# Patient Record
Sex: Female | Born: 1965 | Smoking: Never smoker
Health system: Southern US, Community
[De-identification: ages and names within clinical notes are randomized; demographics above are authoritative.]

## PROBLEM LIST (undated history)

## (undated) DIAGNOSIS — E669 Obesity, unspecified: Secondary | ICD-10-CM

## (undated) DIAGNOSIS — I10 Essential (primary) hypertension: Secondary | ICD-10-CM

## (undated) DIAGNOSIS — N809 Endometriosis, unspecified: Secondary | ICD-10-CM

## (undated) DIAGNOSIS — M722 Plantar fascial fibromatosis: Secondary | ICD-10-CM

## (undated) DIAGNOSIS — D649 Anemia, unspecified: Secondary | ICD-10-CM

## (undated) HISTORY — PX: ABDOMINAL HYSTERECTOMY: SHX81

---

## 2007-06-29 ENCOUNTER — Emergency Department: Payer: Self-pay | Admitting: Internal Medicine

## 2007-07-01 ENCOUNTER — Ambulatory Visit: Payer: Self-pay | Admitting: Rheumatology

## 2007-07-01 ENCOUNTER — Ambulatory Visit: Payer: Self-pay | Admitting: Unknown Physician Specialty

## 2008-04-28 ENCOUNTER — Ambulatory Visit: Payer: Self-pay

## 2008-04-29 ENCOUNTER — Ambulatory Visit: Payer: Self-pay

## 2009-02-01 ENCOUNTER — Ambulatory Visit: Payer: Self-pay | Admitting: Internal Medicine

## 2012-08-22 ENCOUNTER — Ambulatory Visit: Payer: Self-pay | Admitting: Family Medicine

## 2013-07-16 ENCOUNTER — Ambulatory Visit: Payer: Self-pay

## 2013-10-21 ENCOUNTER — Ambulatory Visit: Payer: Self-pay | Admitting: Gynecologic Oncology

## 2013-10-21 ENCOUNTER — Ambulatory Visit: Payer: Self-pay | Admitting: Cardiothoracic Surgery

## 2013-11-18 ENCOUNTER — Ambulatory Visit: Payer: Self-pay | Admitting: Gynecologic Oncology

## 2013-11-18 LAB — URINALYSIS, COMPLETE
Bacteria: NONE SEEN
Blood: NEGATIVE
Glucose,UR: NEGATIVE mg/dL (ref 0–75)
Ketone: NEGATIVE
Leukocyte Esterase: NEGATIVE
Nitrite: NEGATIVE
Protein: NEGATIVE
Specific Gravity: 1.013 (ref 1.003–1.030)
Squamous Epithelial: 1

## 2013-11-18 LAB — HEMOGLOBIN: HGB: 14.4 g/dL (ref 12.0–16.0)

## 2013-11-18 LAB — PREGNANCY, URINE: Pregnancy Test, Urine: NEGATIVE m[IU]/mL

## 2013-11-18 LAB — POTASSIUM: Potassium: 3.6 mmol/L (ref 3.5–5.1)

## 2013-11-20 ENCOUNTER — Ambulatory Visit: Payer: Self-pay | Admitting: Cardiothoracic Surgery

## 2013-11-20 ENCOUNTER — Ambulatory Visit: Payer: Self-pay | Admitting: Gynecologic Oncology

## 2013-11-25 ENCOUNTER — Inpatient Hospital Stay: Payer: Self-pay | Admitting: Obstetrics and Gynecology

## 2013-11-26 LAB — HEMATOCRIT: HCT: 33.1 % — ABNORMAL LOW (ref 35.0–47.0)

## 2013-11-27 LAB — PATHOLOGY REPORT

## 2014-04-07 ENCOUNTER — Ambulatory Visit (INDEPENDENT_AMBULATORY_CARE_PROVIDER_SITE_OTHER): Payer: BC Managed Care – PPO

## 2014-04-07 ENCOUNTER — Encounter: Payer: Self-pay | Admitting: Podiatry

## 2014-04-07 ENCOUNTER — Ambulatory Visit (INDEPENDENT_AMBULATORY_CARE_PROVIDER_SITE_OTHER): Payer: BC Managed Care – PPO | Admitting: Podiatry

## 2014-04-07 ENCOUNTER — Ambulatory Visit: Payer: Self-pay | Admitting: Podiatry

## 2014-04-07 VITALS — BP 125/80 | HR 89 | Resp 16 | Ht 60.0 in | Wt 190.0 lb

## 2014-04-07 DIAGNOSIS — M79673 Pain in unspecified foot: Secondary | ICD-10-CM

## 2014-04-07 DIAGNOSIS — M79609 Pain in unspecified limb: Secondary | ICD-10-CM

## 2014-04-07 DIAGNOSIS — M2010 Hallux valgus (acquired), unspecified foot: Secondary | ICD-10-CM

## 2014-04-07 DIAGNOSIS — M722 Plantar fascial fibromatosis: Secondary | ICD-10-CM

## 2014-04-07 MED ORDER — TRIAMCINOLONE ACETONIDE 10 MG/ML IJ SUSP
10.0000 mg | Freq: Once | INTRAMUSCULAR | Status: AC
  Administered 2014-04-07: 10 mg

## 2014-04-07 MED ORDER — DICLOFENAC SODIUM 75 MG PO TBEC: 75.0000 mg | DELAYED_RELEASE_TABLET | Freq: Two times a day (BID) | ORAL | Status: DC

## 2014-04-07 NOTE — Progress Notes (Signed)
Subjective:     Patient ID: Lindsay Dunn, female   DOB: [birth year], 48 y.o.   MRN: [id number]  Foot Pain   patient states that her right heels been hurting her for around 48 months when she returns from work after hysterectomy. Patient also states she has a bunion left digits occasionally sore and makes it hard wear certain shoes   Review of Systems  All other systems reviewed and are negative.      Objective:   Physical Exam  Nursing note and vitals reviewed. Constitutional: She is oriented to person, place, and time.  Cardiovascular: Intact distal pulses.   Musculoskeletal: Normal range of motion.  Neurological: She is oriented to person, place, and time.  Skin: Skin is warm.   neurovascular status intact with muscle strength adequate and range of motion of the subtalar and midtarsal joint within normal limits. 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 Her focusing on the right foot today I injected with 3 mg Kenalog 5 mg Xylocaine Marcaine mixture into the right plantar heel and I applied fascially brace. Gave instructions on long-term orthotics which we will make him next visit and discuss surgery for the left bunion which we will hold off on as the pain is not to the degree I would expect for her.

## 2014-04-07 NOTE — Progress Notes (Signed)
   Subjective:    Patient ID: Lindsay Dunn, female    DOB: 04-Jun-1966, 48 y.o.   MRN: [id number]  HPI Comments: i have right heel pain and both of my feet swell. Its been going on for a couple of weeks. The pain has gotten worse. It hurts to stand and walk. i bought some straps for my feet.  Foot Pain      Review of Systems  Gastrointestinal: Positive for blood in stool.  All other systems reviewed and are negative.      Objective:   Physical Exam        Assessment & Plan:

## 2014-04-07 NOTE — Patient Instructions (Signed)

## 2014-04-14 ENCOUNTER — Ambulatory Visit: Payer: BC Managed Care – PPO | Admitting: Podiatry

## 2014-04-21 ENCOUNTER — Ambulatory Visit (INDEPENDENT_AMBULATORY_CARE_PROVIDER_SITE_OTHER): Payer: BC Managed Care – PPO | Admitting: Podiatry

## 2014-04-21 DIAGNOSIS — M722 Plantar fascial fibromatosis: Secondary | ICD-10-CM

## 2014-04-22 NOTE — Progress Notes (Signed)
Subjective:     Patient ID: Lindsay Dunn, female   DOB: [birth year], 48 y.o.   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

## 2014-04-29 ENCOUNTER — Encounter: Payer: Self-pay | Admitting: *Deleted

## 2014-04-29 NOTE — Progress Notes (Signed)
Sent pt post card letting her know orthotics are here. 

## 2014-05-05 ENCOUNTER — Ambulatory Visit (INDEPENDENT_AMBULATORY_CARE_PROVIDER_SITE_OTHER): Payer: BC Managed Care – PPO | Admitting: *Deleted

## 2014-05-05 DIAGNOSIS — M722 Plantar fascial fibromatosis: Secondary | ICD-10-CM

## 2014-05-05 NOTE — Patient Instructions (Signed)

## 2014-06-02 ENCOUNTER — Other Ambulatory Visit: Payer: BC Managed Care – PPO

## 2014-10-14 ENCOUNTER — Other Ambulatory Visit: Payer: Self-pay | Admitting: *Deleted

## 2014-10-14 MED ORDER — DICLOFENAC SODIUM 75 MG PO TBEC: 75.0000 mg | DELAYED_RELEASE_TABLET | Freq: Two times a day (BID) | ORAL | Status: AC

## 2014-10-14 NOTE — Telephone Encounter (Signed)
Pharmacy fax refill diclofenac . Ok

## 2014-12-08 ENCOUNTER — Ambulatory Visit: Payer: Self-pay | Admitting: Podiatry

## 2015-03-13 NOTE — Op Note (Signed)
PATIENT NAME:  Lindsay Dunn, Lindsay Dunn MR#:  [id number] DATE OF BIRTH:  Nov 18, 1966  DATE OF PROCEDURE:  11/25/2013  PREOPERATIVE DIAGNOSES: Bilateral adnexal masses, dysmenorrhea.  POSTOPERATIVE DIAGNOSES: Bilateral adnexal masses, dysmenorrhea, severe adhesive disease and endometriosis.   PROCEDURES PERFORMED: Total abdominal hysterectomy, bilateral salpingo-oophorectomy, adhesiolysis, cystoscopy.  SURGEON: Lindsay L. Logan BoresEvans, Dunn  ASSISTANT: Scrub.   ANESTHESIA: General endotracheal.   FINDINGS: Fibroid uterus densely adherent to bilateral adnexa containing what was found to be endometriomas. Large posterior fibroid closely adherent to the rectum.   ANTIBIOTICS: Two grams Ancef IV.   DRAINS: Foley.   COMPLICATIONS: None.   PROCEDURE IN DETAIL: Dr. Wendy PoetBrigitte Miller had evaluated the patient and recommended TAH/BSO. She was on standby throughout the case. With the aid of scrub tech, after prepping and draping, making sure she received antibiotics and placing a Foley catheter a #15 blade was used to make a Pfannenstiel incision and peritoneal entry was made, bowel was packed, and self-retaining retractor was placed. I immediately noted adhesions posteriorly in bilateral adnexa. We began to bluntly and sharply dissect these free. We were able to mobilize and identify the round ligaments which were grasped, ligated and divided bilaterally. We did alternating clamp, cut and tie using 0 Vicryl and Heaney-Ballantine clamps, taking care to develop the bladder flap and avoid the bladder, until we felt like we were past the uterine artery. At this point, we decapitated the uterus and Kocher was placed on the anterior lip of the cervix. There was a  large posterior fibroid which previously was felt to be an adhesion was identified. This was bluntly and sharply dissected free and delivered. We were then able to clamp, cut and tie using straights then curved Heaney-Ballantines delivering the cervix. The cuff was then closed  with mattress sutures of 0 Vicryl.   The left ovary was grasped with Babcock elevator bluntly and dissected so we could get around the round ligament starting around the infundibulopelvic ligament which was clamped with Heaney-Ballantine, divided and suture ligated. We proceeded in a similar fashion on the right. Again, both cysts were ruptured at the beginning of the case with spillage of copious amounts of chocolate fluid.   The pelvis was copiously irrigated. The area seemed to be hemostatic.   The pelvis was flooded again with sterile water and empty [place] was placed through the anus and approximately 100 mL of air was infused with no bubbling seen in the area of the dissection of the posterior fibroid. At this point, we turned our attention to cystoscope.   I asked for methylene blue to be given IV and under cystoscopic evaluation spillage was seen from bilateral internal ureteral orifices. Bladder mucosa appeared to be intact. Foley catheter was replaced. Rectus muscle was inspected for hemostasis and made hemostatic with cautery. The fascia was closed right to left with 0 Vicryl. Subcu was made hemostatic with cautery, and the skin was closed with surgical clips. Pressure dressing was applied.   The patient tolerated the procedure well. I anticipate a routine postoperative course.   Estimated blood loss was estimated to be approximately 700 mL.   ____________________________ Clide Clifficky L. Logan BoresEvans, Dunn rle:sb D: 11/25/2013 15:37:20 ET T: 11/25/2013 15:57:29 ET JOB#: [id number]  cc: Lindsay L. Logan BoresEvans, Dunn, <Dictator> Lindsay Dunn ELECTRONICALLY SIGNED 11/26/2013 8:24

## 2015-03-13 NOTE — Discharge Summary (Signed)
Dates of Admission and Diagnosis:  Date of Admission 25-Nov-2013   Date of Discharge 01-Jan-0001   Admitting Diagnosis adnexal masses   Final Diagnosis same + endometriosis; s/p TAH, LOA, BSO    Chief Complaint/History of Present Illness ibid   Hospital Course:  Hospital Course did well afeb thruout  passed flatus this a.m.   Condition on Discharge Good   DISCHARGE INSTRUCTIONS HOME MEDS:  Medication Reconciliation: Patient's Home Medications at Discharge:     Medication Instructions  hydrochlorothiazide-lisinopril 12.5 mg-10 mg oral tablet  1 tab(s) orally once a day   norco 325 mg-5 mg oral tablet  1/2 - 2 tab(s) orally every 6 hours   acetaminophen-hydrocodone 325 mg-5 mg oral tablet  1 tab(s) orally every 4 to 6 hours, As needed, pain     Physician's Instructions:  Diet Regular   Activity Limitations No exertional activity  No heavy lifting   Return to Work after follow up visit with MD   Time frame for Follow Up Appointment 2-4 weeks   Electronic Signatures: Margaretha GlassingEvans, Ricky L (MD)  (Signed 09-Jan-15 07:35)  Authored: ADMISSION DATE AND DIAGNOSIS, CHIEF COMPLAINT/HPI, HOSPITAL COURSE, DISCHARGE INSTRUCTIONS HOME MEDS, PATIENT INSTRUCTIONS   Last Updated: 09-Jan-15 07:35 by Margaretha GlassingEvans, Ricky L (MD)

## 2015-09-16 ENCOUNTER — Other Ambulatory Visit: Payer: Self-pay | Admitting: Family Medicine

## 2015-09-16 DIAGNOSIS — Z1231 Encounter for screening mammogram for malignant neoplasm of breast: Secondary | ICD-10-CM

## 2015-09-28 ENCOUNTER — Ambulatory Visit
Admission: RE | Admit: 2015-09-28 | Discharge: 2015-09-28 | Disposition: A | Discharge status: Home / Self Care | Payer: BLUE CROSS/BLUE SHIELD | Source: Ambulatory Visit | Attending: Family Medicine | Admitting: Family Medicine

## 2015-09-28 DIAGNOSIS — Z1231 Encounter for screening mammogram for malignant neoplasm of breast: Secondary | ICD-10-CM | POA: Diagnosis not present

## 2015-10-28 ENCOUNTER — Ambulatory Visit
Admission: RE | Admit: 2015-10-28 | Payer: BLUE CROSS/BLUE SHIELD | Source: Ambulatory Visit | Admitting: Gastroenterology

## 2015-10-28 ENCOUNTER — Encounter: Admission: RE | Payer: Self-pay | Source: Ambulatory Visit

## 2015-10-28 SURGERY — COLONOSCOPY WITH PROPOFOL
Anesthesia: General

## 2015-12-03 ENCOUNTER — Encounter: Payer: Self-pay | Admitting: *Deleted

## 2015-12-06 ENCOUNTER — Encounter: Payer: Self-pay | Admitting: *Deleted

## 2015-12-06 ENCOUNTER — Ambulatory Visit
Admission: RE | Admit: 2015-12-06 | Discharge: 2015-12-06 | Disposition: A | Discharge status: Home / Self Care | Payer: BLUE CROSS/BLUE SHIELD | Source: Ambulatory Visit | Attending: Gastroenterology | Admitting: Gastroenterology

## 2015-12-06 ENCOUNTER — Ambulatory Visit: Payer: BLUE CROSS/BLUE SHIELD | Admitting: Anesthesiology

## 2015-12-06 ENCOUNTER — Encounter
Admission: RE | Disposition: A | Discharge status: Home / Self Care | Payer: Self-pay | Source: Ambulatory Visit | Attending: Gastroenterology

## 2015-12-06 DIAGNOSIS — I10 Essential (primary) hypertension: Secondary | ICD-10-CM | POA: Diagnosis not present

## 2015-12-06 DIAGNOSIS — Z8249 Family history of ischemic heart disease and other diseases of the circulatory system: Secondary | ICD-10-CM | POA: Diagnosis not present

## 2015-12-06 DIAGNOSIS — Z801 Family history of malignant neoplasm of trachea, bronchus and lung: Secondary | ICD-10-CM | POA: Insufficient documentation

## 2015-12-06 DIAGNOSIS — R197 Diarrhea, unspecified: Secondary | ICD-10-CM | POA: Insufficient documentation

## 2015-12-06 DIAGNOSIS — Z791 Long term (current) use of non-steroidal anti-inflammatories (NSAID): Secondary | ICD-10-CM | POA: Insufficient documentation

## 2015-12-06 DIAGNOSIS — K625 Hemorrhage of anus and rectum: Secondary | ICD-10-CM | POA: Insufficient documentation

## 2015-12-06 DIAGNOSIS — K635 Polyp of colon: Secondary | ICD-10-CM | POA: Diagnosis not present

## 2015-12-06 DIAGNOSIS — Z79818 Long term (current) use of other agents affecting estrogen receptors and estrogen levels: Secondary | ICD-10-CM | POA: Insufficient documentation

## 2015-12-06 DIAGNOSIS — Z833 Family history of diabetes mellitus: Secondary | ICD-10-CM | POA: Insufficient documentation

## 2015-12-06 DIAGNOSIS — D509 Iron deficiency anemia, unspecified: Secondary | ICD-10-CM | POA: Insufficient documentation

## 2015-12-06 DIAGNOSIS — E669 Obesity, unspecified: Secondary | ICD-10-CM | POA: Insufficient documentation

## 2015-12-06 DIAGNOSIS — Z91013 Allergy to seafood: Secondary | ICD-10-CM | POA: Insufficient documentation

## 2015-12-06 DIAGNOSIS — K529 Noninfective gastroenteritis and colitis, unspecified: Secondary | ICD-10-CM | POA: Diagnosis present

## 2015-12-06 DIAGNOSIS — Z803 Family history of malignant neoplasm of breast: Secondary | ICD-10-CM | POA: Insufficient documentation

## 2015-12-06 DIAGNOSIS — Z79899 Other long term (current) drug therapy: Secondary | ICD-10-CM | POA: Insufficient documentation

## 2015-12-06 DIAGNOSIS — Z90710 Acquired absence of both cervix and uterus: Secondary | ICD-10-CM | POA: Insufficient documentation

## 2015-12-06 HISTORY — DX: Essential (primary) hypertension: I10

## 2015-12-06 HISTORY — DX: Obesity, unspecified: E66.9

## 2015-12-06 HISTORY — DX: Anemia, unspecified: D64.9

## 2015-12-06 HISTORY — DX: Endometriosis, unspecified: N80.9

## 2015-12-06 HISTORY — DX: Plantar fascial fibromatosis: M72.2

## 2015-12-06 HISTORY — PX: COLONOSCOPY WITH PROPOFOL: SHX5780

## 2015-12-06 SURGERY — COLONOSCOPY WITH PROPOFOL
Anesthesia: General

## 2015-12-06 MED ORDER — PROPOFOL 10 MG/ML IV BOLUS
INTRAVENOUS | Status: DC | PRN
  Administered 2015-12-06: 50 mg via INTRAVENOUS

## 2015-12-06 MED ORDER — MIDAZOLAM HCL 2 MG/2ML IJ SOLN
INTRAMUSCULAR | Status: DC | PRN
  Administered 2015-12-06: 1 mg via INTRAVENOUS

## 2015-12-06 MED ORDER — SODIUM CHLORIDE 0.9 % IV SOLN
INTRAVENOUS | Status: DC
  Administered 2015-12-06: 14:00:00 via INTRAVENOUS
  Administered 2015-12-06: 1000 mL via INTRAVENOUS

## 2015-12-06 MED ORDER — PROPOFOL 500 MG/50ML IV EMUL
INTRAVENOUS | Status: DC | PRN
  Administered 2015-12-06: 125 ug/kg/min via INTRAVENOUS

## 2015-12-06 MED ORDER — FENTANYL CITRATE (PF) 100 MCG/2ML IJ SOLN
INTRAMUSCULAR | Status: DC | PRN
  Administered 2015-12-06: 50 ug via INTRAVENOUS

## 2015-12-06 NOTE — Discharge Instructions (Signed)

## 2015-12-06 NOTE — Op Note (Signed)
[facility] Patient Name: Lindsay Dunn Procedure Date: 12/06/2015 2:23 PM MRN: [id number] Account #: [id number] Date of Birth: 1966/10/22 Admit Type: Outpatient Age: 50 Room: [facility] ENDO ROOM 2 Gender: Female Note Status: Finalized Procedure:         Colonoscopy Indications:       Chronic diarrhea, Rectal bleeding Patient Profile:   This is a 50 year old female. Providers:         Rhona Raider. Shelle Iron, MD Referring MD:      Marisue Ivan (Referring MD) Medicines:         Propofol per Anesthesia Complications:     No immediate complications. Procedure:         Pre-Anesthesia Assessment:                    - Prior to the procedure, a History and Physical was                     performed, and patient medications, allergies and                     sensitivities were reviewed. The patient's tolerance of                     previous anesthesia was reviewed.                    After obtaining informed consent, the colonoscope was                     passed under direct vision. Throughout the procedure, the                     patient's blood pressure, pulse, and oxygen saturations                     were monitored continuously. The Olympus CF-H180AL                     colonoscope ( S#: [id number] ) was introduced through the                     anus and advanced to the the terminal ileum. The                     colonoscopy was performed without difficulty. The patient                     tolerated the procedure well. The quality of the bowel                     preparation was good. Findings:      The perianal and digital rectal examinations were normal.      A 3 mm polyp was found in the recto-sigmoid colon. The polyp was       sessile. The polyp was removed with a jumbo cold forceps. Resection and       retrieval were complete.      The terminal ileum appeared normal.      The exam was otherwise without abnormality on direct and retroflexion   views.      Biopsies for histology were taken with a cold forceps from the right       colon, left colon and rectum for evaluation of microscopic colitis. Impression:        -  One 3 mm polyp at the recto-sigmoid colon. Resected and                     retrieved.                    - The examined portion of the ileum was normal.                    - The examination was otherwise normal on direct and                     retroflexion views.                    - Biopsies were taken with a cold forceps from the right                     colon, left colon and rectum for evaluation of microscopic                     colitis. Recommendation:    - Observe patient in GI recovery unit.                    - Resume regular diet.                    - Continue present medications.                    - Await pathology results.                    - Repeat colonoscopy for surveillance based on pathology                     results.                    - Return to GI clinic                    - Try lowfodmap diet, probiotic.                    - Consider xifaxin for IBS-D if biopsies are                     non-diagnostic.                    - The findings and recommendations were discussed with the                     patient.                    - The findings and recommendations were discussed with the                     patient's family. Procedure Code(s): --- Professional ---                    [phone], Colonoscopy, flexible; with biopsy, single or                     multiple Diagnosis Code(s): --- Professional ---                    D12.7, Benign neoplasm of rectosigmoid junction  K52.9, Noninfective gastroenteritis and colitis,                     unspecified                    K62.5, Hemorrhage of anus and rectum CPT copyright 2014 American Medical Association. All rights reserved. The codes documented in this report are preliminary and upon coder review may  be revised to  meet current compliance requirements. Kathalene FramesMatthew G Rein, MD 12/06/2015 2:59:29 PM This report has been signed electronically. Number of Addenda: 0 Note Initiated On: 12/06/2015 2:23 PM Scope Withdrawal Time: 0 hours 13 minutes 25 seconds  Total Procedure Duration: 0 hours 19 minutes 33 seconds       [facility]

## 2015-12-06 NOTE — Anesthesia Preprocedure Evaluation (Signed)
Anesthesia Evaluation  Patient identified by MRN, date of birth, ID band Patient awake    Reviewed: Allergy & Precautions, H&P , NPO status , Patient's Chart, lab work & pertinent test results, reviewed documented beta blocker date and time   History of Anesthesia Complications Negative for: history of anesthetic complications  Airway Mallampati: II  TM Distance: >3 FB Neck ROM: full    Dental no notable dental hx. (+) Teeth Intact, Missing   Pulmonary neg pulmonary ROS,    Pulmonary exam normal breath sounds clear to auscultation       Cardiovascular Exercise Tolerance: Good hypertension, On Medications (-) CAD, (-) Past MI, (-) Cardiac Stents and (-) CABG Normal cardiovascular exam(-) dysrhythmias (-) Valvular Problems/Murmurs Rhythm:regular Rate:Normal     Neuro/Psych negative neurological ROS  negative psych ROS   GI/Hepatic negative GI ROS, Neg liver ROS,   Endo/Other  neg diabetesMorbid obesity  Renal/GU negative Renal ROS  negative genitourinary   Musculoskeletal   Abdominal   Peds  Hematology  (+) Blood dyscrasia, anemia ,   Anesthesia Other Findings Past Medical History:   Hypertension                                                 Endometriosis                                                Anemia                                                         Comment:Iron Deficiency Anemia   Plantar fasciitis                                            Obesity                                                      Reproductive/Obstetrics negative OB ROS                             Anesthesia Physical Anesthesia Plan  ASA: III  Anesthesia Plan: General   Post-op Pain Management:    Induction:   Airway Management Planned:   Additional Equipment:   Intra-op Plan:   Post-operative Plan:   Informed Consent: I have reviewed the patients History and Physical, chart, labs and  discussed the procedure including the risks, benefits and alternatives for the proposed anesthesia with the patient or authorized representative who has indicated his/her understanding and acceptance.   Dental Advisory Given  Plan Discussed with: Anesthesiologist, CRNA and Surgeon  Anesthesia Plan Comments:         Anesthesia Quick Evaluation

## 2015-12-06 NOTE — H&P (Signed)
  Primary Care Physician:  Marisue IvanLINTHAVONG, KANHKA, MD  Pre-Procedure History & Physical: HPI:  Lindsay Dunn is a 50 y.o. female is here for an colonoscopy.   Past Medical History  Diagnosis Date  . Hypertension   . Endometriosis   . Anemia     Iron Deficiency Anemia  . Plantar fasciitis   . Obesity     Past Surgical History  Procedure Laterality Date  . Abdominal hysterectomy      Prior to Admission medications   Medication Sig Start Date End Date Taking? Authorizing Provider  conjugated estrogens (PREMARIN) vaginal cream Place 1 Applicatorful vaginally 2 (two) times a week.   Yes Historical Provider, MD  etodolac (LODINE) 500 MG tablet Take 500 mg by mouth 2 (two) times daily.   Yes Historical Provider, MD  FERROUS SULFATE PO Take by mouth daily.   Yes Historical Provider, MD  lisinopril-hydrochlorothiazide (PRINZIDE,ZESTORETIC) 20-12.5 MG per tablet  03/04/14  Yes Historical Provider, MD  diclofenac (VOLTAREN) 75 MG EC tablet Take 1 tablet (75 mg total) by mouth 2 (two) times daily. Patient not taking: Reported on 12/06/2015 10/14/14   Kirstie PeriNorman S Regal, DPM  Ibuprofen (MOTRIN PO) Take by mouth as needed.    Historical Provider, MD    Allergies as of 12/02/2015 - Review Complete 05/05/2014  Allergen Reaction Noted  . Fish allergy Swelling 04/07/2014  . Shrimp [shellfish allergy] Swelling 04/07/2014    Family History  Problem Relation Age of Onset  . Breast cancer Maternal Aunt   . Lung cancer Mother   . Heart failure Father   . Diabetes Mellitus II Sister     Social History   Social History  . Marital Status: Unknown    Spouse Name: N/A  . Number of Children: N/A  . Years of Education: N/A   Occupational History  . Not on file.   Social History Main Topics  . Smoking status: Never Smoker   . Smokeless tobacco: Never Used  . Alcohol Use: No  . Drug Use: No  . Sexual Activity: Not on file   Other Topics Concern  . Not on file   Social History Narrative      Physical Exam: BP 132/90 mmHg  Pulse 73  Temp(Src) 97.8 F (36.6 C) (Tympanic)  Resp 20  Ht 5' (1.524 m)  Wt 90.719 kg (200 lb)  BMI 39.06 kg/m2  SpO2 96% General:   Alert,  pleasant and cooperative in NAD Head:  Normocephalic and atraumatic. Neck:  Supple; no masses or thyromegaly. Lungs:  Clear throughout to auscultation.    Heart:  Regular rate and rhythm. Abdomen:  Soft, nontender and nondistended. Normal bowel sounds, without guarding, and without rebound.   Neurologic:  Alert and  oriented x4;  grossly normal neurologically.  Impression/Plan: Lindsay RamaBetty J Inlow is here for an colonoscopy to be performed for rectal bleeding, diarrhea  Risks, benefits, limitations, and alternatives regarding  colonoscopy have been reviewed with the patient.  Questions have been answered.  All parties agreeable.   Elnita MaxwellEIN, MATTHEW GORDON, MD  12/06/2015, 2:21 PM

## 2015-12-06 NOTE — Transfer of Care (Signed)
Immediate Anesthesia Transfer of Care Note  Patient: Lindsay Dunn  Procedure(s) Performed: Procedure(s): COLONOSCOPY WITH PROPOFOL (N/A)  Patient Location: PACU  Anesthesia Type:General  Level of Consciousness: awake, alert  and sedated  Airway & Oxygen Therapy: Patient Spontanous Breathing and Patient connected to nasal cannula oxygen  Post-op Assessment: Report given to RN and Post -op Vital signs reviewed and stable  Post vital signs: Reviewed and stable  Last Vitals:  Filed Vitals:   12/06/15 1325  BP: 132/90  Pulse: 73  Temp: 36.6 C  Resp: 20    Complications: No apparent anesthesia complications

## 2015-12-08 ENCOUNTER — Encounter: Payer: Self-pay | Admitting: Gastroenterology

## 2015-12-08 NOTE — Anesthesia Postprocedure Evaluation (Signed)
Anesthesia Post Note  Patient: Lindsay Dunn  Procedure(s) Performed: Procedure(s) (LRB): COLONOSCOPY WITH PROPOFOL (N/A)  Patient location during evaluation: Endoscopy Anesthesia Type: General Level of consciousness: awake and alert Pain management: pain level controlled Vital Signs Assessment: post-procedure vital signs reviewed and stable Respiratory status: spontaneous breathing, nonlabored ventilation, respiratory function stable and patient connected to nasal cannula oxygen Cardiovascular status: blood pressure returned to baseline and stable Postop Assessment: no signs of nausea or vomiting Anesthetic complications: no    Last Vitals:  Filed Vitals:   12/06/15 1510 12/06/15 1520  BP: 129/79 125/81  Pulse: 75 71  Temp:    Resp: 14 15    Last Pain: There were no vitals filed for this visit.               Lenard Simmer

## 2015-12-09 LAB — SURGICAL PATHOLOGY

## 2016-11-21 ENCOUNTER — Other Ambulatory Visit: Payer: Self-pay | Admitting: Family Medicine

## 2016-11-21 DIAGNOSIS — Z1231 Encounter for screening mammogram for malignant neoplasm of breast: Secondary | ICD-10-CM

## 2016-12-19 ENCOUNTER — Ambulatory Visit
Admission: RE | Admit: 2016-12-19 | Discharge: 2016-12-19 | Disposition: A | Discharge status: Home / Self Care | Payer: BLUE CROSS/BLUE SHIELD | Source: Ambulatory Visit | Attending: Family Medicine | Admitting: Family Medicine

## 2016-12-19 DIAGNOSIS — Z1231 Encounter for screening mammogram for malignant neoplasm of breast: Secondary | ICD-10-CM | POA: Diagnosis present

## 2017-12-05 ENCOUNTER — Other Ambulatory Visit: Payer: Self-pay | Admitting: Family Medicine

## 2017-12-05 DIAGNOSIS — Z1231 Encounter for screening mammogram for malignant neoplasm of breast: Secondary | ICD-10-CM

## 2017-12-20 ENCOUNTER — Ambulatory Visit
Admission: RE | Admit: 2017-12-20 | Discharge: 2017-12-20 | Disposition: A | Discharge status: Home / Self Care | Payer: BLUE CROSS/BLUE SHIELD | Source: Ambulatory Visit | Attending: Family Medicine | Admitting: Family Medicine

## 2017-12-20 DIAGNOSIS — Z1231 Encounter for screening mammogram for malignant neoplasm of breast: Secondary | ICD-10-CM | POA: Insufficient documentation

## 2019-01-28 ENCOUNTER — Other Ambulatory Visit: Payer: Self-pay | Admitting: Family Medicine

## 2019-01-28 DIAGNOSIS — Z1231 Encounter for screening mammogram for malignant neoplasm of breast: Secondary | ICD-10-CM

## 2019-05-27 ENCOUNTER — Ambulatory Visit
Admission: RE | Admit: 2019-05-27 | Discharge: 2019-05-27 | Disposition: A | Discharge status: Home / Self Care | Payer: PRIVATE HEALTH INSURANCE | Source: Ambulatory Visit | Attending: Family Medicine | Admitting: Family Medicine

## 2019-05-27 DIAGNOSIS — Z1231 Encounter for screening mammogram for malignant neoplasm of breast: Secondary | ICD-10-CM | POA: Diagnosis present

## 2019-05-30 ENCOUNTER — Other Ambulatory Visit: Payer: Self-pay | Admitting: Family Medicine

## 2019-05-30 DIAGNOSIS — N6489 Other specified disorders of breast: Secondary | ICD-10-CM

## 2019-05-30 DIAGNOSIS — R928 Other abnormal and inconclusive findings on diagnostic imaging of breast: Secondary | ICD-10-CM

## 2019-06-27 ENCOUNTER — Ambulatory Visit
Admission: RE | Admit: 2019-06-27 | Discharge: 2019-06-27 | Disposition: A | Discharge status: Home / Self Care | Payer: PRIVATE HEALTH INSURANCE | Source: Ambulatory Visit | Attending: Family Medicine | Admitting: Family Medicine

## 2019-06-27 DIAGNOSIS — R928 Other abnormal and inconclusive findings on diagnostic imaging of breast: Secondary | ICD-10-CM | POA: Diagnosis not present

## 2019-06-27 DIAGNOSIS — N6489 Other specified disorders of breast: Secondary | ICD-10-CM | POA: Diagnosis present

## 2020-08-11 ENCOUNTER — Other Ambulatory Visit: Payer: Self-pay | Admitting: Family Medicine

## 2020-08-11 DIAGNOSIS — Z1231 Encounter for screening mammogram for malignant neoplasm of breast: Secondary | ICD-10-CM

## 2020-09-23 ENCOUNTER — Ambulatory Visit
Admission: RE | Admit: 2020-09-23 | Discharge: 2020-09-23 | Disposition: A | Discharge status: Home / Self Care | Payer: 59 | Source: Ambulatory Visit | Attending: Family Medicine | Admitting: Family Medicine

## 2020-09-23 ENCOUNTER — Other Ambulatory Visit: Payer: Self-pay

## 2020-09-23 DIAGNOSIS — Z1231 Encounter for screening mammogram for malignant neoplasm of breast: Secondary | ICD-10-CM | POA: Diagnosis not present

## 2021-12-13 DIAGNOSIS — E213 Hyperparathyroidism, unspecified: Secondary | ICD-10-CM | POA: Diagnosis not present

## 2021-12-13 DIAGNOSIS — E559 Vitamin D deficiency, unspecified: Secondary | ICD-10-CM | POA: Diagnosis not present

## 2021-12-16 ENCOUNTER — Other Ambulatory Visit: Payer: Self-pay | Admitting: Family Medicine

## 2021-12-16 DIAGNOSIS — Z1231 Encounter for screening mammogram for malignant neoplasm of breast: Secondary | ICD-10-CM

## 2021-12-19 ENCOUNTER — Other Ambulatory Visit: Payer: Self-pay | Admitting: Family Medicine

## 2021-12-19 DIAGNOSIS — N6320 Unspecified lump in the left breast, unspecified quadrant: Secondary | ICD-10-CM

## 2021-12-30 DIAGNOSIS — I10 Essential (primary) hypertension: Secondary | ICD-10-CM | POA: Diagnosis not present

## 2021-12-30 DIAGNOSIS — Z0000 Encounter for general adult medical examination without abnormal findings: Secondary | ICD-10-CM | POA: Diagnosis not present

## 2022-01-06 ENCOUNTER — Other Ambulatory Visit: Payer: Self-pay | Admitting: Family Medicine

## 2022-01-06 DIAGNOSIS — Z1231 Encounter for screening mammogram for malignant neoplasm of breast: Secondary | ICD-10-CM

## 2022-02-10 ENCOUNTER — Other Ambulatory Visit: Payer: Self-pay

## 2022-02-10 ENCOUNTER — Ambulatory Visit
Admission: RE | Admit: 2022-02-10 | Discharge: 2022-02-10 | Disposition: A | Discharge status: Home / Self Care | Payer: Self-pay | Source: Ambulatory Visit | Attending: Family Medicine | Admitting: Family Medicine

## 2022-02-10 DIAGNOSIS — Z1231 Encounter for screening mammogram for malignant neoplasm of breast: Secondary | ICD-10-CM | POA: Insufficient documentation

## 2022-05-09 DIAGNOSIS — R197 Diarrhea, unspecified: Secondary | ICD-10-CM | POA: Diagnosis not present

## 2022-05-09 DIAGNOSIS — R1032 Left lower quadrant pain: Secondary | ICD-10-CM | POA: Diagnosis not present

## 2022-05-09 DIAGNOSIS — R109 Unspecified abdominal pain: Secondary | ICD-10-CM | POA: Diagnosis not present

## 2022-05-15 ENCOUNTER — Other Ambulatory Visit: Payer: Self-pay | Admitting: Family Medicine

## 2022-05-15 DIAGNOSIS — R109 Unspecified abdominal pain: Secondary | ICD-10-CM

## 2022-05-15 DIAGNOSIS — R1032 Left lower quadrant pain: Secondary | ICD-10-CM

## 2022-05-25 ENCOUNTER — Ambulatory Visit
Admission: RE | Admit: 2022-05-25 | Discharge: 2022-05-25 | Disposition: A | Discharge status: Home / Self Care | Payer: Self-pay | Source: Ambulatory Visit | Attending: Family Medicine | Admitting: Family Medicine

## 2022-05-25 DIAGNOSIS — R1032 Left lower quadrant pain: Secondary | ICD-10-CM

## 2022-05-25 DIAGNOSIS — R109 Unspecified abdominal pain: Secondary | ICD-10-CM

## 2022-05-25 MED ORDER — IOPAMIDOL (ISOVUE-300) INJECTION 61%
100.0000 mL | Freq: Once | INTRAVENOUS | Status: AC | PRN
  Administered 2022-05-25: 100 mL via INTRAVENOUS

## 2022-11-09 ENCOUNTER — Other Ambulatory Visit: Payer: Self-pay

## 2022-11-09 DIAGNOSIS — N281 Cyst of kidney, acquired: Secondary | ICD-10-CM

## 2023-02-08 ENCOUNTER — Other Ambulatory Visit: Payer: Self-pay | Admitting: Family Medicine

## 2023-02-08 DIAGNOSIS — N281 Cyst of kidney, acquired: Secondary | ICD-10-CM

## 2023-03-01 ENCOUNTER — Other Ambulatory Visit: Payer: 59

## 2023-03-15 ENCOUNTER — Other Ambulatory Visit: Payer: Self-pay | Admitting: Family Medicine

## 2023-03-15 ENCOUNTER — Ambulatory Visit
Admission: RE | Admit: 2023-03-15 | Discharge: 2023-03-15 | Disposition: A | Discharge status: Home / Self Care | Payer: 59 | Source: Ambulatory Visit | Attending: Family Medicine | Admitting: Family Medicine

## 2023-03-15 DIAGNOSIS — N281 Cyst of kidney, acquired: Secondary | ICD-10-CM

## 2023-03-15 MED ORDER — GADOPICLENOL 0.5 MMOL/ML IV SOLN
9.0000 mL | Freq: Once | INTRAVENOUS | Status: AC | PRN
  Administered 2023-03-15: 9 mL via INTRAVENOUS

## 2023-06-18 IMAGING — MG MM DIGITAL SCREENING BILAT W/ TOMO AND CAD
8 series · 8 of 24 positions shown · non-contrast
Comparison: Previous exam(s).

CLINICAL DATA: Screening.

EXAM:
DIGITAL SCREENING BILATERAL MAMMOGRAM WITH TOMOSYNTHESIS AND CAD
TECHNIQUE: Bilateral screening digital craniocaudal and mediolateral oblique
mammograms were obtained. Bilateral screening digital breast
tomosynthesis was performed. The images were evaluated with
computer-aided detection.

[R MLO synth-2D]
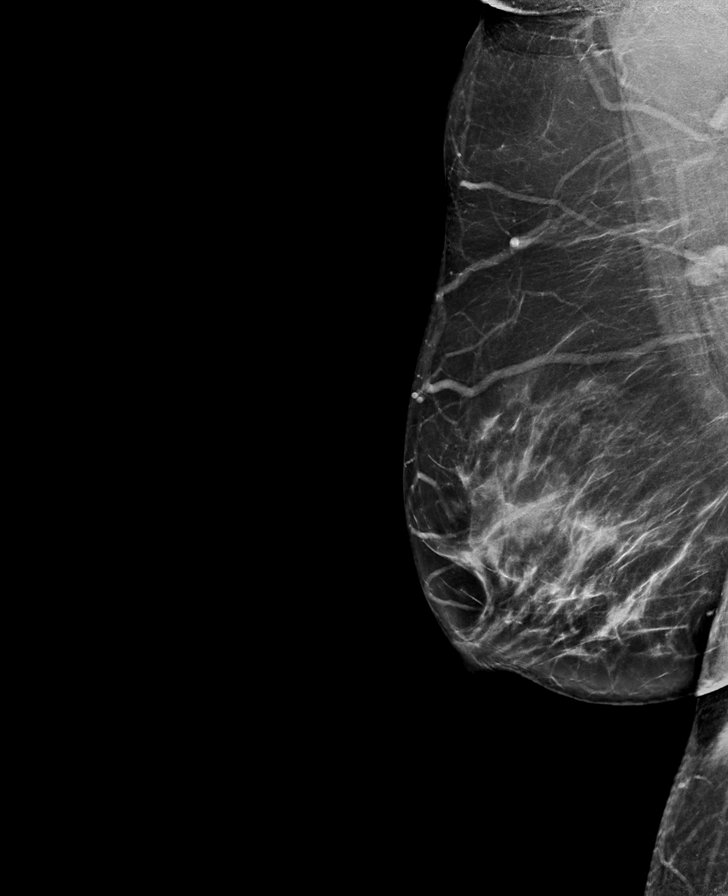

[L MLO synth-2D]
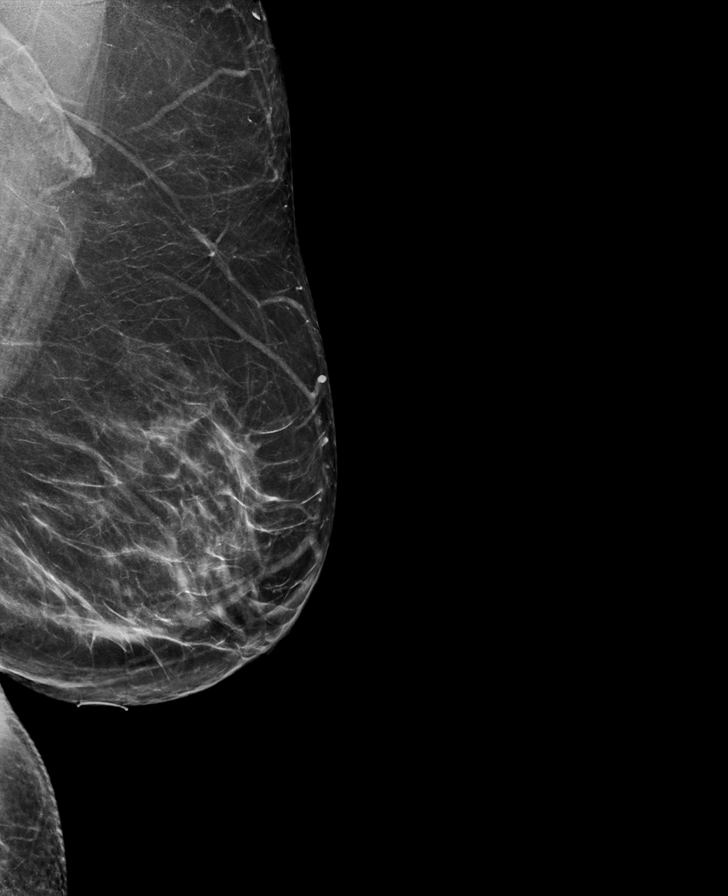

[R CC synth-2D]
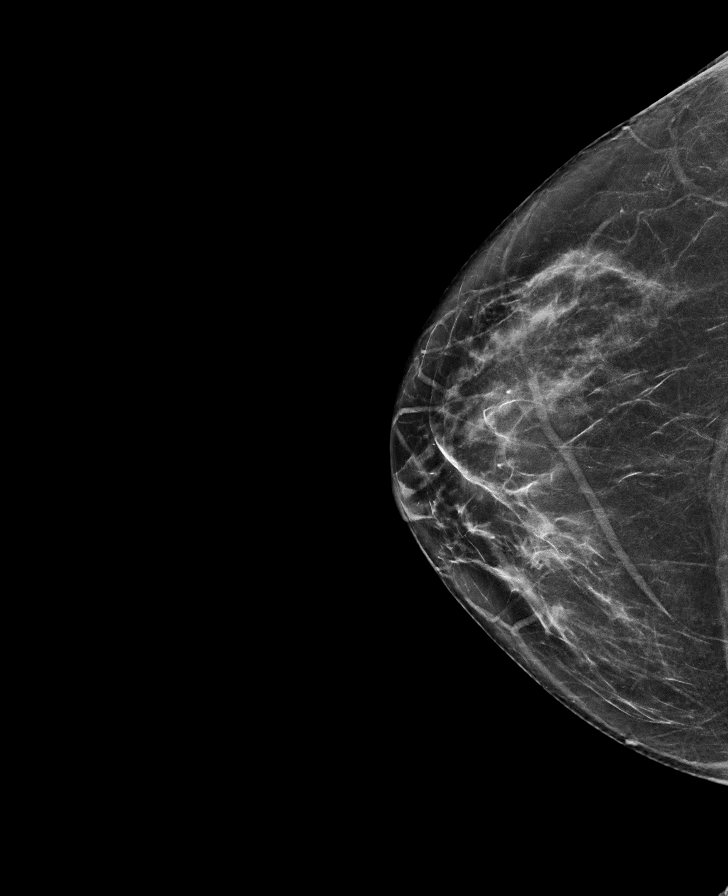

[L CC synth-2D]
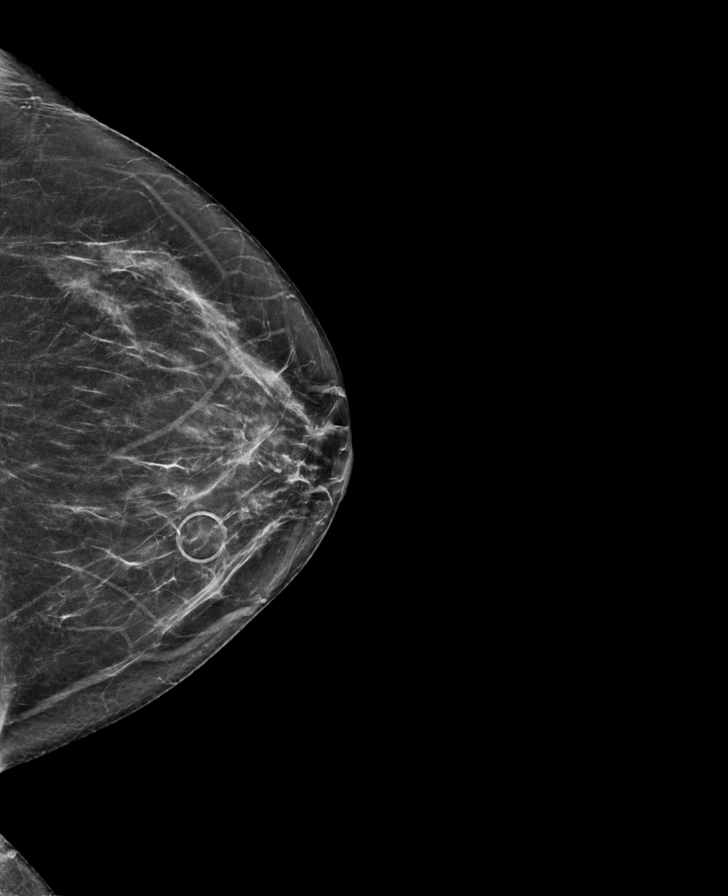

[R MLO tomo · tomo slice 40/79.0]
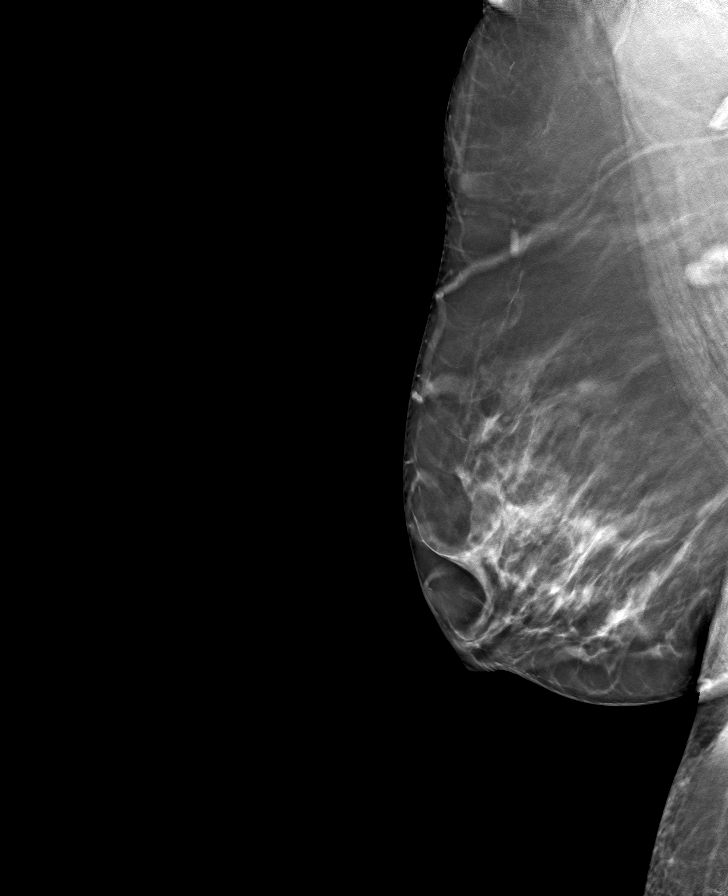

[L MLO tomo · tomo slice 38/75.0]
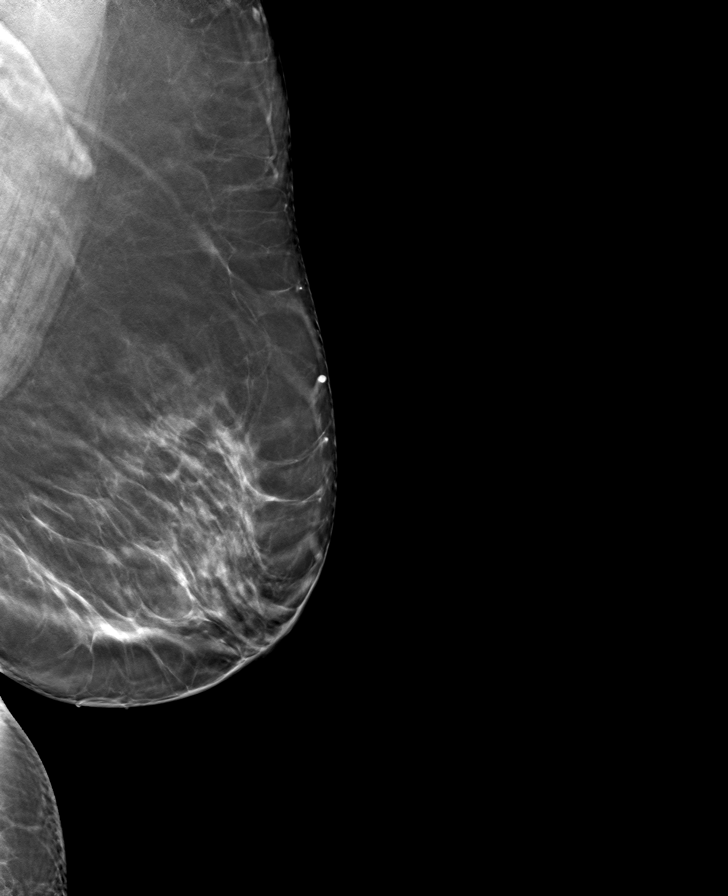

[R CC tomo · tomo slice 35/68.0]
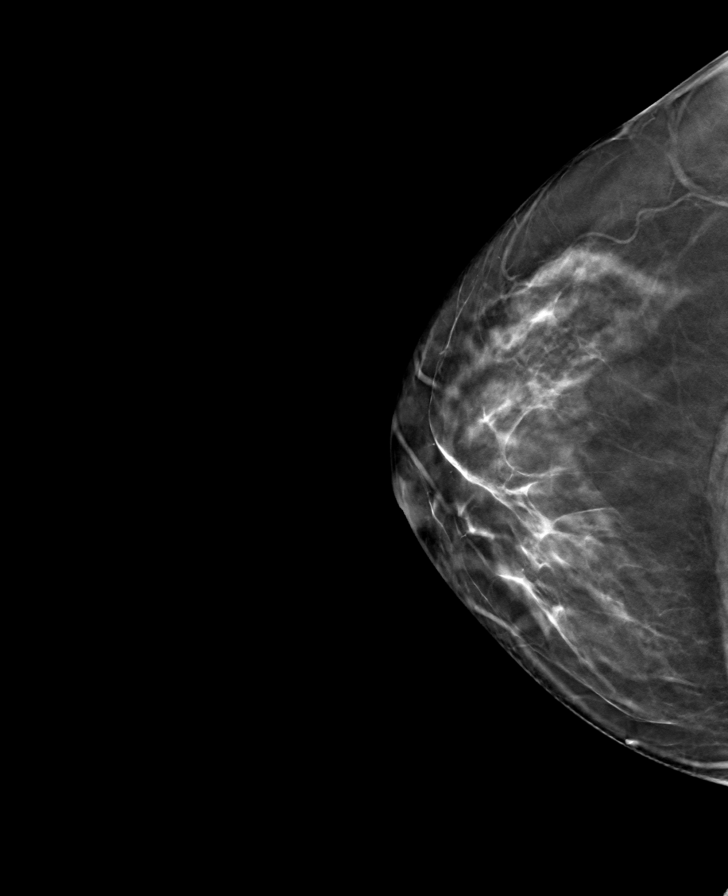

[L CC tomo · tomo slice 33/65.0]
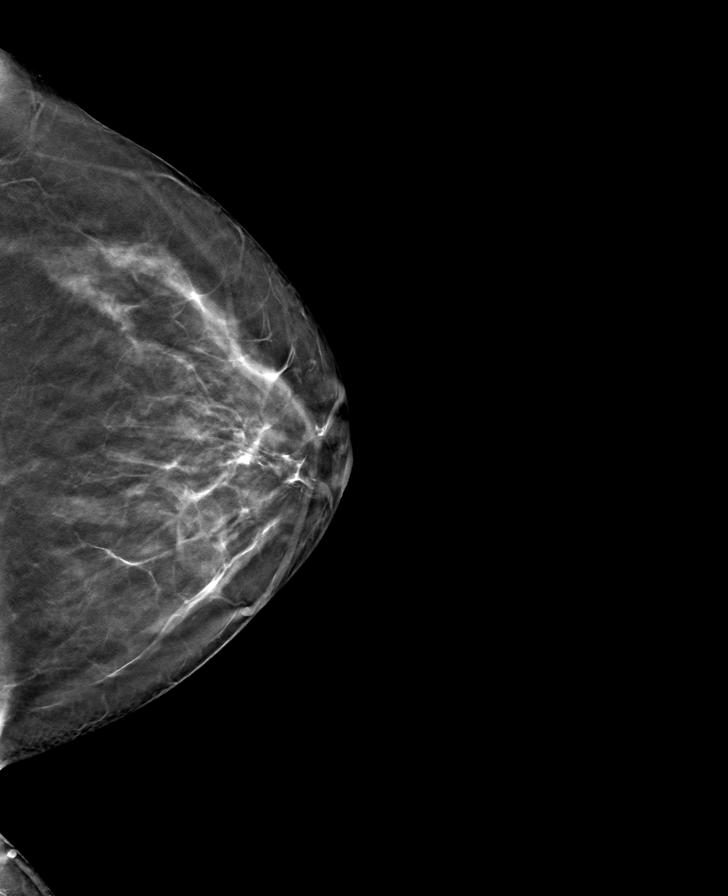

[8 of 24 positions shown; findings below may reference images not displayed]

ACR Breast Density Category c: The breast tissue is heterogeneously
dense, which may obscure small masses.
FINDINGS: There are no findings suspicious for malignancy.
IMPRESSION: No mammographic evidence of malignancy. A result letter of this
screening mammogram will be mailed directly to the patient.

RECOMMENDATION:
Screening mammogram in one year. (Code:Q3-W-BC3)

BI-RADS CATEGORY  1: Negative.

## 2024-03-21 ENCOUNTER — Other Ambulatory Visit: Payer: Self-pay | Admitting: Family Medicine

## 2024-03-21 DIAGNOSIS — Z1231 Encounter for screening mammogram for malignant neoplasm of breast: Secondary | ICD-10-CM

## 2024-03-25 ENCOUNTER — Encounter

## 2024-03-26 ENCOUNTER — Ambulatory Visit
Admission: RE | Admit: 2024-03-26 | Discharge: 2024-03-26 | Disposition: A | Discharge status: Home / Self Care | Source: Ambulatory Visit | Attending: Family Medicine | Admitting: Family Medicine

## 2024-03-26 DIAGNOSIS — Z1231 Encounter for screening mammogram for malignant neoplasm of breast: Secondary | ICD-10-CM | POA: Diagnosis present
